# Patient Record
Sex: Female | Born: 1990 | Race: White | Hispanic: No | Marital: Married | Smoking: Never smoker
Health system: Southern US, Community
[De-identification: ages and names within clinical notes are randomized; demographics above are authoritative.]

## PROBLEM LIST (undated history)

## (undated) DIAGNOSIS — E282 Polycystic ovarian syndrome: Secondary | ICD-10-CM

## (undated) DIAGNOSIS — F419 Anxiety disorder, unspecified: Secondary | ICD-10-CM

## (undated) DIAGNOSIS — B019 Varicella without complication: Secondary | ICD-10-CM

## (undated) DIAGNOSIS — T7840XA Allergy, unspecified, initial encounter: Secondary | ICD-10-CM

## (undated) HISTORY — PX: TONSILLECTOMY: SUR1361

## (undated) HISTORY — DX: Polycystic ovarian syndrome: E28.2

## (undated) HISTORY — DX: Anxiety disorder, unspecified: F41.9

## (undated) HISTORY — DX: Allergy, unspecified, initial encounter: T78.40XA

## (undated) HISTORY — DX: Varicella without complication: B01.9

---
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
## 2011-01-12 LAB — URINALYSIS, ROUTINE W REFLEX MICROSCOPIC
Nitrite: NEGATIVE
Protein, ur: 100 — AB
Specific Gravity, Urine: 1.021
Urobilinogen, UA: 1

## 2011-01-12 LAB — COMPREHENSIVE METABOLIC PANEL
ALT: 20
Alkaline Phosphatase: 86
BUN: 9
CO2: 26
Calcium: 9.6
Glucose, Bld: 109 — ABNORMAL HIGH
Potassium: 4.6
Sodium: 139
Total Protein: 7

## 2011-01-12 LAB — URINE MICROSCOPIC-ADD ON

## 2011-01-12 LAB — DIFFERENTIAL
Basophils Relative: 1
Eosinophils Absolute: 0.3
Monocytes Relative: 7
Neutro Abs: 5.7
Neutrophils Relative %: 68

## 2011-01-12 LAB — CBC
HCT: 40.2
Hemoglobin: 13.6
MCHC: 33.8
RBC: 5
RDW: 14.9 — ABNORMAL HIGH

## 2011-01-12 LAB — LIPASE, BLOOD: Lipase: 25

## 2011-01-12 LAB — PREGNANCY, URINE: Preg Test, Ur: NEGATIVE

## 2011-10-15 ENCOUNTER — Encounter: Payer: Self-pay | Admitting: Family Medicine

## 2011-10-15 ENCOUNTER — Ambulatory Visit (INDEPENDENT_AMBULATORY_CARE_PROVIDER_SITE_OTHER): Payer: BC Managed Care – PPO | Admitting: Family Medicine

## 2011-10-15 VITALS — BP 128/74 | HR 119 | Temp 98.2°F | Ht 68.5 in | Wt 378.0 lb

## 2011-10-15 DIAGNOSIS — F419 Anxiety disorder, unspecified: Secondary | ICD-10-CM

## 2011-10-15 DIAGNOSIS — F411 Generalized anxiety disorder: Secondary | ICD-10-CM

## 2011-10-15 LAB — CBC WITH DIFFERENTIAL/PLATELET
Basophils Relative: 0.4 % (ref 0.0–3.0)
Eosinophils Absolute: 0.5 10*3/uL (ref 0.0–0.7)
Eosinophils Relative: 4 % (ref 0.0–5.0)
HCT: 42.4 % (ref 36.0–46.0)
Hemoglobin: 13.9 g/dL (ref 12.0–15.0)
MCHC: 32.9 g/dL (ref 30.0–36.0)
MCV: 83.2 fl (ref 78.0–100.0)
Monocytes Absolute: 0.6 10*3/uL (ref 0.1–1.0)
Monocytes Relative: 4.8 % (ref 3.0–12.0)
Neutro Abs: 8.4 10*3/uL — ABNORMAL HIGH (ref 1.4–7.7)
Neutrophils Relative %: 68.1 % (ref 43.0–77.0)
RBC: 5.09 Mil/uL (ref 3.87–5.11)
WBC: 12.4 10*3/uL — ABNORMAL HIGH (ref 4.5–10.5)

## 2011-10-15 LAB — BASIC METABOLIC PANEL
Calcium: 9.4 mg/dL (ref 8.4–10.5)
Creatinine, Ser: 0.9 mg/dL (ref 0.4–1.2)
GFR: 83.1 mL/min (ref >60.00)
Sodium: 140 mEq/L (ref 135–145)

## 2011-10-15 LAB — HEPATIC FUNCTION PANEL
Alkaline Phosphatase: 72 U/L (ref 39–117)
Bilirubin, Direct: 0.1 mg/dL (ref 0.0–0.3)
Total Bilirubin: 0.4 mg/dL (ref 0.3–1.2)
Total Protein: 7.6 g/dL (ref 6.0–8.3)

## 2011-10-15 MED ORDER — FLUOXETINE HCL 20 MG PO TABS: 20.0000 mg | ORAL_TABLET | Freq: Every day | ORAL | Status: DC

## 2011-10-15 MED ORDER — ALPRAZOLAM 0.5 MG PO TABS: 0.5000 mg | ORAL_TABLET | Freq: Three times a day (TID) | ORAL | Status: AC | PRN

## 2011-10-15 NOTE — Progress Notes (Signed)
  Subjective:    Patient ID: Kristen Burns, female    DOB: [birth year], 21 y.o.   MRN: [id number]  HPI 21 yr old female to establish and to discuss anxiety. She says she feels anxious all the time and worries about things every day. Some days are worse than others, and she gets episodes where she feels so anxious that she gets SOB with chest pressure, her heart races, and she feels like she is "going to die". This happens about twice a week. She started having problems with anxiety in 2008, and she took Lexapro for awhile at that time. This did not help her much. Her family members have a lot of anxiety issues as well, and her father has panic attacks. She is a Holiday representative at [employer]. She also has polycystic ovaries, and she took Metformin and BCP for a few years. She has not seen her GYN for several years.    Review of Systems  Constitutional: Negative.   Respiratory: Positive for chest tightness. Negative for cough and wheezing.   Cardiovascular: Positive for chest pain and palpitations. Negative for leg swelling.  Gastrointestinal: Negative.   Genitourinary: Negative.   Neurological: Negative.   Psychiatric/Behavioral: Positive for disturbed wake/sleep cycle, decreased concentration and agitation. Negative for hallucinations, behavioral problems, confusion and dysphoric mood. The patient is nervous/anxious. The patient is not hyperactive.        Objective:   Physical Exam  Constitutional: She is oriented to person, place, and time. No distress.       Morbidly obese   Neck: No thyromegaly present.  Cardiovascular: Normal rate, regular rhythm, normal heart sounds and intact distal pulses.   Pulmonary/Chest: Effort normal and breath sounds normal.  Lymphadenopathy:    She has no cervical adenopathy.  Neurological: She is alert and oriented to person, place, and time.  Psychiatric: She has a normal mood and affect. Her behavior is normal. Judgment and thought content normal.           Assessment & Plan:  This sounds like classic panic disorder, with a long hx of anxiety. She seems to have no real depression issues. We will start her on Prozac 20 mg a day and supplement with Xanax prn. Get labs today to rule out metabolic problems. Recheck in 3 weeks. We spent 45 minutes discussing this, at least 50% of which was in counseling.

## 2011-10-16 NOTE — Progress Notes (Signed)
Quick Note:  I spoke with pt ______ 

## 2011-11-05 ENCOUNTER — Ambulatory Visit: Payer: BC Managed Care – PPO | Admitting: Family Medicine

## 2011-11-07 ENCOUNTER — Encounter: Payer: Self-pay | Admitting: Family Medicine

## 2011-11-07 ENCOUNTER — Ambulatory Visit (INDEPENDENT_AMBULATORY_CARE_PROVIDER_SITE_OTHER): Payer: BC Managed Care – PPO | Admitting: Family Medicine

## 2011-11-07 VITALS — HR 130 | Temp 98.3°F

## 2011-11-07 DIAGNOSIS — F419 Anxiety disorder, unspecified: Secondary | ICD-10-CM | POA: Insufficient documentation

## 2011-11-07 DIAGNOSIS — F411 Generalized anxiety disorder: Secondary | ICD-10-CM

## 2011-11-07 MED ORDER — FLUOXETINE HCL 20 MG PO TABS: 40.0000 mg | ORAL_TABLET | Freq: Every day | ORAL | Status: DC

## 2011-11-07 NOTE — Progress Notes (Signed)
  Subjective:    Patient ID: Kristen Burns, female    DOB: February 24, 1991, 21 y.o.   MRN: [id number]  HPI Here with her mother to follow up an anxiety and panic attacks. She has been taking Prozac 20 mg a day for 3 weeks, and she does think it has helped a little. She still has a lot of daily anxiety however, and she has 2-3 panic attacks a week. She has used Xanax about once a day, and she gets immediate relief with this. She has started on psychotherapy once a week with Dallie Dad at the [facility].    Review of Systems  Constitutional: Negative.   Respiratory: Positive for chest tightness and shortness of breath. Negative for cough and wheezing.   Cardiovascular: Positive for chest pain and palpitations. Negative for leg swelling.  Psychiatric/Behavioral: Positive for agitation. The patient is nervous/anxious.        Objective:   Physical Exam  Constitutional: She appears well-developed and well-nourished.  Cardiovascular: Normal rate, regular rhythm, normal heart sounds and intact distal pulses.   Pulmonary/Chest: Effort normal and breath sounds normal.  Psychiatric: Her behavior is normal. Thought content normal.       Anxious and tearful          Assessment & Plan:  We will increase the Prozac to 40 mg a day. Continue with prn Xanax and therapy. Recheck in one week

## 2011-11-14 ENCOUNTER — Ambulatory Visit: Payer: BC Managed Care – PPO | Admitting: Family Medicine

## 2011-11-14 DIAGNOSIS — Z0289 Encounter for other administrative examinations: Secondary | ICD-10-CM

## 2011-11-21 ENCOUNTER — Ambulatory Visit: Payer: BC Managed Care – PPO | Admitting: Family Medicine

## 2011-11-23 ENCOUNTER — Encounter: Payer: Self-pay | Admitting: Family Medicine

## 2011-11-23 ENCOUNTER — Ambulatory Visit (INDEPENDENT_AMBULATORY_CARE_PROVIDER_SITE_OTHER): Payer: BC Managed Care – PPO | Admitting: Family Medicine

## 2011-11-23 VITALS — BP 120/82 | HR 98 | Temp 98.3°F | Wt 373.0 lb

## 2011-11-23 DIAGNOSIS — F419 Anxiety disorder, unspecified: Secondary | ICD-10-CM

## 2011-11-23 DIAGNOSIS — F411 Generalized anxiety disorder: Secondary | ICD-10-CM

## 2011-11-23 MED ORDER — FLUOXETINE HCL 40 MG PO CAPS: 40.0000 mg | ORAL_CAPSULE | Freq: Every day | ORAL | Status: DC

## 2011-11-23 NOTE — Progress Notes (Signed)
  Subjective:    Patient ID: Kristen Burns, female    DOB: [birth year], 21 y.o.   MRN: [id number]  HPI Here to follow up on anxiety. Since we doubled her Prozac to 40 mg a day she has done extremely well. She has had no panic attacks and has not required a single xanax in the past week. She is relaxed and happy, and she has no side effects.    Review of Systems  Constitutional: Negative.   Psychiatric/Behavioral: Negative.        Objective:   Physical Exam  Constitutional: She appears well-developed and well-nourished.  Psychiatric: She has a normal mood and affect. Her behavior is normal. Thought content normal.          Assessment & Plan:  She is doing well. Stay on 40 mg a day of prozac. Recheck in 3 months

## 2012-02-20 ENCOUNTER — Ambulatory Visit: Payer: BC Managed Care – PPO | Admitting: Family Medicine

## 2012-02-20 DIAGNOSIS — Z0289 Encounter for other administrative examinations: Secondary | ICD-10-CM

## 2012-06-05 ENCOUNTER — Telehealth: Payer: Self-pay | Admitting: Family Medicine

## 2012-06-05 MED ORDER — ALPRAZOLAM 0.5 MG PO TABS: 0.5000 mg | ORAL_TABLET | Freq: Three times a day (TID) | ORAL | Status: DC | PRN

## 2012-06-05 NOTE — Telephone Encounter (Signed)
Patient Information:  Caller Name: Denine  Phone: [phone]  Patient: Kristen Burns, Kristen Burns  Gender: Female  DOB: June 15, 1990  Age: 22 Years  PCP: Gershon Crane [facility])  Pregnant: No  Office Follow Up:  Does the office need to follow up with this patient?: Yes  Instructions For The Office: Call back regarding possible work in appointment for panic attack today vs refill Xanax.  RN Note:  Emergent Call:  regarding panic attack. LMP mid December/ History PCOS with irregular menses. Advised to see MD within 24 hours per nursing judgement (since no current Xanax Rx, last office visit 11/23/11 and emergent call for panic attack) for history of panic attack and current attack not resolved per Anxiety:Panic guideline.  After explained RN recommends appointment, she reported she is feeling "much better" now with lessening symptoms since she took her last "left over" 0.25 Xanax.  Has not been taking Xanax 0.5 mg TID since she has not needed it.  Asking for refill before leaves to go out of town today after 1400 06/05/12.  [facility]  No appointments remain with Dr Clent Ridges; Informed staff will call back regarding possible work-in appointment for today.   Symptoms  Reason For Call & Symptoms: Panic attack; Emergent call to get Xanax refilled before going out of town until 06/10/12.  Reports with racing pulse, generalized anxiety, mild short of breath, nervous and sweaty palms at 0945 06/05/12.  Not currently on Prosac  Reviewed Health History In EMR: Yes  Reviewed Medications In EMR: Yes  Reviewed Allergies In EMR: Yes  Reviewed Surgeries / Procedures: Yes  Date of Onset of Symptoms: 06/05/2012  Treatments Tried: 0.25 Xanax  Treatments Tried Worked: No OB / GYN:  LMP: 03/19/2012  Guideline(s) Used:  No Protocol Available - Sick Adult  Disposition Per Guideline:   See Today in Office  Reason For Disposition Reached:   Nursing judgment  Advice Given:  N/A

## 2012-06-05 NOTE — Telephone Encounter (Signed)
Call in Xanax 0.5 mg tid prn anxiety, #90 with 2 rf

## 2012-06-05 NOTE — Telephone Encounter (Signed)
I called in script and spoke with pt. She will call back to schedule the office visit, due to going out of town tomorrow.

## 2012-06-24 ENCOUNTER — Ambulatory Visit (INDEPENDENT_AMBULATORY_CARE_PROVIDER_SITE_OTHER): Payer: BC Managed Care – PPO | Admitting: Family Medicine

## 2012-06-24 ENCOUNTER — Encounter: Payer: Self-pay | Admitting: Family Medicine

## 2012-06-24 VITALS — BP 130/80 | HR 114 | Temp 97.8°F

## 2012-06-24 DIAGNOSIS — F411 Generalized anxiety disorder: Secondary | ICD-10-CM

## 2012-06-24 DIAGNOSIS — F419 Anxiety disorder, unspecified: Secondary | ICD-10-CM

## 2012-06-24 MED ORDER — FLUOXETINE HCL 40 MG PO CAPS: 40.0000 mg | ORAL_CAPSULE | Freq: Every day | ORAL | Status: DC

## 2012-06-24 MED ORDER — ALPRAZOLAM 0.5 MG PO TABS: 0.5000 mg | ORAL_TABLET | Freq: Three times a day (TID) | ORAL | Status: DC | PRN

## 2012-06-24 NOTE — Progress Notes (Signed)
  Subjective:    Patient ID: Kristen Burns, female    DOB: [birth year], 22 y.o.   MRN: [id number]  HPI Here with mother for a flare of her anxiety. Last summer she had been doing quite well on prozac but she stopped taking this about 3 months ago, then several weeks ago she started having anxiety attacks again with SOB and chest discomfort. She uses Xanax sporadically.    Review of Systems  Constitutional: Negative.   Respiratory: Positive for chest tightness and shortness of breath.   Cardiovascular: Positive for chest pain. Negative for palpitations and leg swelling.  Psychiatric/Behavioral: Negative for confusion, dysphoric mood, decreased concentration and agitation. The patient is nervous/anxious.        Objective:   Physical Exam  Constitutional: She appears well-developed and well-nourished. No distress.  Cardiovascular: Normal rate, regular rhythm, normal heart sounds and intact distal pulses.   Pulmonary/Chest: Effort normal and breath sounds normal.  Psychiatric: She has a normal mood and affect. Her behavior is normal. Thought content normal.          Assessment & Plan:  She will start back on Prozac and I strongly urged her to never stop this without consulting with [place] first.

## 2012-10-20 ENCOUNTER — Ambulatory Visit: Payer: BC Managed Care – PPO | Admitting: Family Medicine

## 2012-10-23 ENCOUNTER — Ambulatory Visit: Payer: BC Managed Care – PPO | Admitting: Family Medicine

## 2012-10-28 ENCOUNTER — Ambulatory Visit (INDEPENDENT_AMBULATORY_CARE_PROVIDER_SITE_OTHER): Payer: BC Managed Care – PPO | Admitting: Family Medicine

## 2012-10-28 ENCOUNTER — Encounter: Payer: Self-pay | Admitting: Family Medicine

## 2012-10-28 VITALS — BP 130/86 | HR 97 | Temp 98.4°F

## 2012-10-28 DIAGNOSIS — F411 Generalized anxiety disorder: Secondary | ICD-10-CM

## 2012-10-28 DIAGNOSIS — Z0000 Encounter for general adult medical examination without abnormal findings: Secondary | ICD-10-CM

## 2012-10-28 DIAGNOSIS — F419 Anxiety disorder, unspecified: Secondary | ICD-10-CM

## 2012-10-28 LAB — POCT URINALYSIS DIPSTICK
Glucose, UA: NEGATIVE
Ketones, UA: NEGATIVE
Spec Grav, UA: 1.02

## 2012-10-28 LAB — CBC WITH DIFFERENTIAL/PLATELET
Basophils Relative: 0.6 % (ref 0.0–3.0)
Eosinophils Relative: 5.7 % — ABNORMAL HIGH (ref 0.0–5.0)
HCT: 42.6 % (ref 36.0–46.0)
Hemoglobin: 13.8 g/dL (ref 12.0–15.0)
Lymphs Abs: 2.4 10*3/uL (ref 0.7–4.0)
Monocytes Relative: 5.2 % (ref 3.0–12.0)
Neutro Abs: 6.4 10*3/uL (ref 1.4–7.7)
WBC: 9.9 10*3/uL (ref 4.5–10.5)

## 2012-10-28 LAB — HEPATIC FUNCTION PANEL
ALT: 22 U/L (ref 0–35)
Alkaline Phosphatase: 73 U/L (ref 39–117)
Bilirubin, Direct: 0.1 mg/dL (ref 0.0–0.3)
Total Bilirubin: 0.4 mg/dL (ref 0.3–1.2)

## 2012-10-28 LAB — BASIC METABOLIC PANEL
Calcium: 9.5 mg/dL (ref 8.4–10.5)
Chloride: 104 mEq/L (ref 96–112)
Creatinine, Ser: 1 mg/dL (ref 0.4–1.2)
Sodium: 138 mEq/L (ref 135–145)

## 2012-10-28 LAB — LIPID PANEL
LDL Cholesterol: 123 mg/dL — ABNORMAL HIGH (ref 0–99)
Total CHOL/HDL Ratio: 5
Triglycerides: 124 mg/dL (ref 0.0–149.0)

## 2012-10-28 LAB — TSH: TSH: 1.86 u[IU]/mL (ref 0.35–5.50)

## 2012-10-28 MED ORDER — FLUOXETINE HCL 40 MG PO CAPS: 40.0000 mg | ORAL_CAPSULE | Freq: Two times a day (BID) | ORAL | Status: DC

## 2012-10-28 MED ORDER — ALPRAZOLAM 0.5 MG PO TABS: 0.5000 mg | ORAL_TABLET | Freq: Three times a day (TID) | ORAL | Status: DC | PRN

## 2012-10-28 NOTE — Progress Notes (Signed)
  Subjective:    Patient ID: Kristen Burns, female    DOB: [birth year], 22 y.o.   MRN: [id number]  HPI Here to follow up on anxiety. She did well on Prozac for several months, but lately her panic attacks have been more frequent and more intense. She feels her heart racing and pounding, and she thinks she is going to die. Several family members have been in the hospital lately, and she worries about them. She asks of she can see a Psychiatrist for these issues. She graduated college in May and has been looking for a job since then.    Review of Systems  Respiratory: Negative.   Cardiovascular: Positive for palpitations. Negative for chest pain and leg swelling.  Psychiatric/Behavioral: Negative for behavioral problems, confusion, dysphoric mood, decreased concentration and agitation. The patient is nervous/anxious.        Objective:   Physical Exam  Constitutional: She appears well-developed and well-nourished.  Cardiovascular: Normal rate, regular rhythm, normal heart sounds and intact distal pulses.   Pulmonary/Chest: Effort normal and breath sounds normal.  Psychiatric: Her behavior is normal. Thought content normal.  Very anxious          Assessment & Plan:  We will increase the prozac to 40 mg bid. Add Xanax prn. I asked her to check with her insurance company to get a list of preferred psychiatrists, and we can help her get in touch with one of them. Get labs today

## 2012-10-30 ENCOUNTER — Telehealth: Payer: Self-pay | Admitting: Family Medicine

## 2012-10-30 NOTE — Telephone Encounter (Signed)
PT is calling to inquire about her lab results from 10/28/12. Please assist.

## 2012-10-31 NOTE — Progress Notes (Signed)
Quick Note:  I released results in my chart. ______ 

## 2012-10-31 NOTE — Telephone Encounter (Signed)
I left voice message with results and also I did release them in my chart.

## 2012-12-15 ENCOUNTER — Ambulatory Visit: Payer: BC Managed Care – PPO | Admitting: Family Medicine

## 2013-02-05 ENCOUNTER — Other Ambulatory Visit: Payer: Self-pay

## 2013-03-20 ENCOUNTER — Encounter: Payer: Self-pay | Admitting: Family Medicine

## 2013-03-20 ENCOUNTER — Ambulatory Visit (INDEPENDENT_AMBULATORY_CARE_PROVIDER_SITE_OTHER): Payer: BC Managed Care – PPO | Admitting: Family Medicine

## 2013-03-20 VITALS — BP 120/90 | Temp 97.4°F

## 2013-03-20 DIAGNOSIS — H6123 Impacted cerumen, bilateral: Secondary | ICD-10-CM

## 2013-03-20 DIAGNOSIS — H6120 Impacted cerumen, unspecified ear: Secondary | ICD-10-CM

## 2013-03-20 NOTE — Progress Notes (Signed)
Chief Complaint  Patient presents with  . Otalgia    left ear     HPI:  Acute visit for:  Ear Discomfort: -started about 3 days ago -L ear pain -mild, more discomfort like something in it, chrornic allergy issues not really worse then usual -denies: hearing loss, fevers, drainage from ear -has tried ibuprofen -she did put qtip in ear because itching last week to try to get wax out  ROS: See pertinent positives and negatives per HPI.  Past Medical History  Diagnosis Date  . Anxiety   . Chickenpox   . Allergy   . PCOS (polycystic ovarian syndrome)     sees [facility]    Past Surgical History  Procedure Laterality Date  . Tonsillectomy      Family History  Problem Relation Age of Onset  . Arthritis Mother   . Hyperlipidemia Mother   . Diabetes Mother   . Breast cancer Mother   . Arthritis Father   . Hyperlipidemia Father   . Hypertension Father   . Anxiety disorder Father   . Breast cancer    . Anxiety disorder      History   Social History  . Marital Status: Single    Spouse Name: N/A    Number of Children: N/A  . Years of Education: N/A   Social History Main Topics  . Smoking status: Never Smoker   . Smokeless tobacco: Never Used  . Alcohol Use: Yes     Comment: rare  . Drug Use: No  . Sexual Activity: None   Other Topics Concern  . None   Social History Narrative  . None    Current outpatient prescriptions:ALPRAZolam (XANAX) 0.5 MG tablet, Take 1 tablet (0.5 mg total) by mouth 3 (three) times daily as needed for anxiety., Disp: 90 tablet, Rfl: 2;  FLUoxetine (PROZAC) 40 MG capsule, Take 1 capsule (40 mg total) by mouth 2 (two) times daily., Disp: 60 capsule, Rfl: 5  EXAM:  Filed Vitals:   03/20/13 0800  BP: 120/90  Temp: 97.4 F (36.3 C)    Body mass index is 0.00 kg/(m^2).  GENERAL: vitals reviewed and listed above, alert, oriented, appears well hydrated and in no acute distress  HEENT: atraumatic, conjunttiva clear, no  obvious abnormalities on inspection of external nose and ears, cerumen impaction bilat ear canals, other wise normal exam  NECK: no obvious masses on inspection  LUNGS: clear to auscultation bilaterally, no wheezes, rales or rhonchi, good air movement  CV: HRRR, no peripheral edema  MS: moves all extremities without noticeable abnormality  PSYCH: pleasant and cooperative, no obvious depression or anxiety  ASSESSMENT AND PLAN:  Discussed the following assessment and plan:  Cerumen impaction, bilateral  -advised removing the wax for further exam of ears with currette or lavage, but pt prefers to try OTC ear drops for this and see her ENT if not improving -advised I was not able to visualize TM or part of ear canal, so can not rule out pathology in these areas and thus she would need to be seen if symptoms worsening or not improving   There are no Patient Instructions on file for this visit.   Kriste Basque R.

## 2013-03-20 NOTE — Progress Notes (Signed)
Pre visit review using our clinic review tool, if applicable. No additional management support is needed unless otherwise documented below in the visit note. 

## 2013-05-20 ENCOUNTER — Telehealth: Payer: Self-pay | Admitting: Family Medicine

## 2013-05-20 NOTE — Telephone Encounter (Signed)
Pt is on schedule for 05/21/13 to see Dr. Clent RidgesFry.

## 2013-05-20 NOTE — Telephone Encounter (Signed)
Patient Information:  Caller Name: Alexandr  Phone: [phone]  Patient: Kristen Burns, Kristen Burns  Gender: Female  DOB: 1990/07/04  Age: 23 Years  PCP: Gershon CraneFry, [facility])  Pregnant: No  Office Follow Up:  Does the office need to follow up with this patient?: No  Instructions For The Office: N/A  RN Note:  Irrregular menses, LMP 1/15.  Foot pain present when walks or stands for prolonged period of time.  Advised to see MD within 72 hours per nursing judgement.  Requested appointment for 05/21/13.  Symptoms  Reason For Call & Symptoms: Intermittent red color of toes and heels after standing or walking with mild aching in left lower leg, not just calf.  Redness resolves with rest and elevation.  Non smoker  Reviewed Health History In EMR: Yes  Reviewed Medications In EMR: Yes  Reviewed Allergies In EMR: Yes  Reviewed Surgeries / Procedures: Yes  Date of Onset of Symptoms: 05/17/2013  Treatments Tried: rest and elevate  Treatments Tried Worked: Yes OB / GYN:  LMP: Unknown  Guideline(s) Used:  Leg Pain  Foot Pain  Disposition Per Guideline:   See Within 2 Weeks in Office  Reason For Disposition Reached:   Foot pain is a chronic symptom (recurrent or ongoing AND lasting > 4 weeks)  Advice Given:  Aggravating Factors   Aging: Foot pain is more common in the elderly. During the aging process, the feet widen and flatten, and the skin becomes drier.  Pregnancy: Foot pain is more common in pregnant women. During pregnancy hormones cause the ligaments to relax, and the extra weight causes increased stress on the feet.  Obesity: Foot pain is also more common in overweight individuals. Being overweight puts excess stress on the bones and soft tissues of the feet.  Overuse: People that are in professions that require prolonged standing and walking commonly report foot pain.  Shoes: Poorly fitting or overly tight shoes are the underlying cause of many painful foot conditions. High  heels are bad for feet.  Call Back If:  Swelling, redness, or fever occur  Severe pain not relieved by pain medication  Pain lasts over 7 days  You become worse.  Patient Will Follow Care Advice:  YES  Appointment Scheduled:  05/21/2013 10:30:00 Appointment Scheduled Provider:  Gershon CraneFry, [facility](Family Practice)

## 2013-05-21 ENCOUNTER — Ambulatory Visit: Payer: Self-pay | Admitting: Family Medicine

## 2013-05-21 DIAGNOSIS — Z0289 Encounter for other administrative examinations: Secondary | ICD-10-CM

## 2013-06-26 ENCOUNTER — Encounter: Payer: Self-pay | Admitting: Family Medicine

## 2013-06-26 ENCOUNTER — Ambulatory Visit (INDEPENDENT_AMBULATORY_CARE_PROVIDER_SITE_OTHER): Payer: BC Managed Care – PPO | Admitting: Family Medicine

## 2013-06-26 VITALS — BP 120/70 | HR 100 | Temp 98.3°F

## 2013-06-26 DIAGNOSIS — E282 Polycystic ovarian syndrome: Secondary | ICD-10-CM | POA: Insufficient documentation

## 2013-06-26 DIAGNOSIS — R232 Flushing: Secondary | ICD-10-CM

## 2013-06-26 LAB — BASIC METABOLIC PANEL
BUN: 10 mg/dL (ref 6–23)
CHLORIDE: 105 meq/L (ref 96–112)
CO2: 29 mEq/L (ref 19–32)
Calcium: 9.4 mg/dL (ref 8.4–10.5)
Creatinine, Ser: 1 mg/dL (ref 0.4–1.2)
GFR: 72.49 mL/min (ref >60.00)
Glucose, Bld: 94 mg/dL (ref 70–99)
POTASSIUM: 3.7 meq/L (ref 3.5–5.1)
SODIUM: 141 meq/L (ref 135–145)

## 2013-06-26 LAB — CBC WITH DIFFERENTIAL/PLATELET
Basophils Absolute: 0 10*3/uL (ref 0.0–0.1)
Basophils Relative: 0.4 % (ref 0.0–3.0)
EOS PCT: 4 % (ref 0.0–5.0)
Eosinophils Absolute: 0.4 10*3/uL (ref 0.0–0.7)
HEMATOCRIT: 41.6 % (ref 36.0–46.0)
HEMOGLOBIN: 13.7 g/dL (ref 12.0–15.0)
LYMPHS ABS: 2.9 10*3/uL (ref 0.7–4.0)
Lymphocytes Relative: 28.4 % (ref 12.0–46.0)
MCHC: 32.9 g/dL (ref 30.0–36.0)
MCV: 82.7 fl (ref 78.0–100.0)
MONO ABS: 0.5 10*3/uL (ref 0.1–1.0)
MONOS PCT: 4.9 % (ref 3.0–12.0)
NEUTROS ABS: 6.3 10*3/uL (ref 1.4–7.7)
Neutrophils Relative %: 62.3 % (ref 43.0–77.0)
PLATELETS: 260 10*3/uL (ref 150.0–400.0)
RBC: 5.03 Mil/uL (ref 3.87–5.11)
RDW: 14.9 % — AB (ref 11.5–14.6)
WBC: 10.1 10*3/uL (ref 4.5–10.5)

## 2013-06-26 LAB — HEPATIC FUNCTION PANEL
ALT: 23 U/L (ref 0–35)
AST: 20 U/L (ref 0–37)
Albumin: 4.2 g/dL (ref 3.5–5.2)
Alkaline Phosphatase: 78 U/L (ref 39–117)
BILIRUBIN DIRECT: 0 mg/dL (ref 0.0–0.3)
BILIRUBIN TOTAL: 0.3 mg/dL (ref 0.3–1.2)
Total Protein: 7.7 g/dL (ref 6.0–8.3)

## 2013-06-26 LAB — HEMOGLOBIN A1C: Hgb A1c MFr Bld: 6.2 % (ref 4.6–6.5)

## 2013-06-26 LAB — TSH: TSH: 1.74 u[IU]/mL (ref 0.35–5.50)

## 2013-06-26 MED ORDER — ALPRAZOLAM 0.5 MG PO TABS: 0.5000 mg | ORAL_TABLET | Freq: Three times a day (TID) | ORAL | Status: DC | PRN

## 2013-06-26 MED ORDER — ASPIRIN EC 325 MG PO TBEC: 325.0000 mg | DELAYED_RELEASE_TABLET | Freq: Every day | ORAL | Status: DC

## 2013-06-26 NOTE — Progress Notes (Signed)
Pre visit review using our clinic review tool, if applicable. No additional management support is needed unless otherwise documented below in the visit note. 

## 2013-06-26 NOTE — Progress Notes (Signed)
   Subjective:    Patient ID: Kristen Burns, female    DOB: 1990/07/13, 23 y.o.   MRN: [id number]  HPI Here asking about flushing that comes and goes in the hands, feet, and face. This started about 6 months ago but is getting more prominent. The area affected most often is the left foot. It gets red and warm at times, but it does not swell and is not painful. No SOB. Her anxiety has been stable. She has not had a menses for over a year, presumably due to her PCOS. Her last GYN exam was several years ago.    Review of Systems  Constitutional: Negative.   Respiratory: Negative.   Cardiovascular: Negative.   Skin: Positive for color change.  Hematological: Negative.        Objective:   Physical Exam  Constitutional: She appears well-developed and well-nourished.  Neck: No thyromegaly present.  Cardiovascular: Normal rate, regular rhythm, normal heart sounds and intact distal pulses.   Pulmonary/Chest: Effort normal and breath sounds normal.  Musculoskeletal: She exhibits no edema.  Lymphadenopathy:    She has no cervical adenopathy.  Skin: Skin is warm and dry. No rash noted. No erythema. No pallor.          Assessment & Plan:  Flushing of uncertain etiology. Get labs today. Start on aspirin daily. Strongly encouraged her to follow up with her GYN soon.

## 2013-06-27 LAB — ANGIOTENSIN CONVERTING ENZYME: ANGIOTENSIN-CONVERTING ENZYME: 59 U/L — AB (ref 8–52)

## 2013-06-29 LAB — ANTI-DNA ANTIBODY, DOUBLE-STRANDED: ds DNA Ab: <1 IU/mL

## 2013-06-30 ENCOUNTER — Telehealth: Payer: Self-pay | Admitting: Family Medicine

## 2013-06-30 NOTE — Telephone Encounter (Signed)
Pt would like blood work results °

## 2013-06-30 NOTE — Telephone Encounter (Signed)
I left a voice message with lab results.

## 2013-07-24 ENCOUNTER — Encounter: Payer: Self-pay | Admitting: Family Medicine

## 2013-08-14 ENCOUNTER — Encounter: Payer: Self-pay | Admitting: Family Medicine

## 2013-08-14 ENCOUNTER — Telehealth: Payer: Self-pay | Admitting: Family Medicine

## 2013-08-14 ENCOUNTER — Ambulatory Visit (INDEPENDENT_AMBULATORY_CARE_PROVIDER_SITE_OTHER): Payer: BC Managed Care – PPO | Admitting: Family Medicine

## 2013-08-14 VITALS — BP 140/82 | HR 112 | Temp 98.2°F | Resp 20

## 2013-08-14 DIAGNOSIS — J209 Acute bronchitis, unspecified: Secondary | ICD-10-CM

## 2013-08-14 NOTE — Progress Notes (Signed)
   Subjective:    Patient ID: Kristen Burns, female    DOB: 1990/04/12, 23 y.o.   MRN: [id number]  Cough Associated symptoms include wheezing. Pertinent negatives include no chills, fever or shortness of breath.   Acute visit. Two day history of cough. Mostly nonproductive. She's had some nasal congestion postnasal drip. Nonsmoker. No history of asthma. Possibly some mild wheezing off and on. No fevers or chills. No dyspnea.  Initial elevated blood pressure here but she has history of reported whitecoat syndrome and chronic anxiety. She took one Xanax when first got here and blood pressure promptly improved  Past Medical History  Diagnosis Date  . Anxiety   . Chickenpox   . Allergy   . PCOS (polycystic ovarian syndrome)     sees [facility]   Past Surgical History  Procedure Laterality Date  . Tonsillectomy      reports that she has never smoked. She has never used smokeless tobacco. She reports that she drinks alcohol. She reports that she does not use illicit drugs. family history includes Anxiety disorder in her father and another family member; Arthritis in her father and mother; Breast cancer in her mother and another family member; Diabetes in her mother; Hyperlipidemia in her father and mother; Hypertension in her father. Allergies  Allergen Reactions  . Penicillins     hives  . Sulfa Antibiotics     Hives       Review of Systems  Constitutional: Negative for fever and chills.  HENT: Positive for congestion.   Respiratory: Positive for cough and wheezing. Negative for shortness of breath.        Objective:   Physical Exam  Constitutional: She appears well-developed and well-nourished.  HENT:  Right Ear: External ear normal.  Left Ear: External ear normal.  Mouth/Throat: Oropharynx is clear and moist.  Neck: Neck supple.  Cardiovascular: Normal rate and regular rhythm.   Pulmonary/Chest: Effort normal and breath sounds normal. No respiratory  distress. She has no wheezes. She has no rales.          Assessment & Plan:  Cough probably secondary to viral bronchitis. No indication for antibiotics at this time. Possible allergic postnasal drip symptoms. Try over-the-counter Allegra or Claritin. Avoid decongestants.

## 2013-08-14 NOTE — Progress Notes (Signed)
Pre-visit discussion using our clinic review tool. No additional management support is needed unless otherwise documented below in the visit note.  

## 2013-08-14 NOTE — Telephone Encounter (Signed)
Left message to advise pt to see Dr. Caryl NeverBurchette at 3:30 as scheduled

## 2013-08-14 NOTE — Telephone Encounter (Signed)
Patient Information:  Caller Name: Agatha  Phone: [phone]  Patient: Kristen Burns, Kristen Burns  Gender: Female  DOB: 06-May-1990  Age: 23 Years  PCP: Gershon CraneFry, [facility](Family Practice)  Pregnant: No  Office Follow Up:  Does the office need to follow up with this patient?: Yes  Instructions For The Office: Dr. Claris CheFry's schedule full krs/can  RN Note:  Onset of cold symptoms 08/12/13 but cough started shortly thereafter, and now patient c/o chest/rib pain on coughing. Afebrile.  States she has mild wheeze.  Per cough protocol, disposition See in Office Now due to presence of wheeze; info to office for provider review/workin per office protocol.  As Dr. Claris CheFry's schedule is full, back up appt scheduled first available 1530 08/14/13 with Dr. Caryl NeverBurchette.  May reach patient at [phone].  krs/can  Symptoms  Reason For Call & Symptoms: cough  Reviewed Health History In EMR: Yes  Reviewed Medications In EMR: Yes  Reviewed Allergies In EMR: Yes  Reviewed Surgeries / Procedures: Yes  Date of Onset of Symptoms: 08/12/2013 OB / GYN:  LMP: Unknown  Guideline(s) Used:  Cough  Disposition Per Guideline:   Go to Office Now  Reason For Disposition Reached:   Wheezing is present  Advice Given:  N/A  Patient Will Follow Care Advice:  YES

## 2013-08-14 NOTE — Patient Instructions (Signed)
Acute Bronchitis Bronchitis is inflammation of the airways that extend from the windpipe into the lungs (bronchi). The inflammation often causes mucus to develop. This leads to a cough, which is the most common symptom of bronchitis.  In acute bronchitis, the condition usually develops suddenly and goes away over time, usually in a couple weeks. Smoking, allergies, and asthma can make bronchitis worse. Repeated episodes of bronchitis may cause further lung problems.  CAUSES Acute bronchitis is most often caused by the same virus that causes a cold. The virus can spread from person to person (contagious).  SIGNS AND SYMPTOMS   Cough.   Fever.   Coughing up mucus.   Body aches.   Chest congestion.   Chills.   Shortness of breath.   Sore throat.  DIAGNOSIS  Acute bronchitis is usually diagnosed through a physical exam. Tests, such as chest X-rays, are sometimes done to rule out other conditions.  TREATMENT  Acute bronchitis usually goes away in a couple weeks. Often times, no medical treatment is necessary. Medicines are sometimes given for relief of fever or cough. Antibiotics are usually not needed but may be prescribed in certain situations. In some cases, an inhaler may be recommended to help reduce shortness of breath and control the cough. A cool mist vaporizer may also be used to help thin bronchial secretions and make it easier to clear the chest.  HOME CARE INSTRUCTIONS  Get plenty of rest.   Drink enough fluids to keep your urine clear or pale yellow (unless you have a medical condition that requires fluid restriction). Increasing fluids may help thin your secretions and will prevent dehydration.   Only take over-the-counter or prescription medicines as directed by your health care provider.   Avoid smoking and secondhand smoke. Exposure to cigarette smoke or irritating chemicals will make bronchitis worse. If you are a smoker, consider using nicotine gum or skin  patches to help control withdrawal symptoms. Quitting smoking will help your lungs heal faster.   Reduce the chances of another bout of acute bronchitis by washing your hands frequently, avoiding people with cold symptoms, and trying not to touch your hands to your mouth, nose, or eyes.   Follow up with your health care provider as directed.  SEEK MEDICAL CARE IF: Your symptoms do not improve after 1 week of treatment.  SEEK IMMEDIATE MEDICAL CARE IF:  You develop an increased fever or chills.   You have chest pain.   You have severe shortness of breath.  You have bloody sputum.   You develop dehydration.  You develop fainting.  You develop repeated vomiting.  You develop a severe headache. MAKE SURE YOU:   Understand these instructions.  Will watch your condition.  Will get help right away if you are not doing well or get worse. Document Released: 04/26/2004 Document Revised: 11/19/2012 Document Reviewed: 09/09/2012 [facility] Patient Information 2014 [employer] [place].  Avoid decongestants Try over the counter Allegra or Claritan or Zyrtec for nasal congestion symptoms.

## 2013-12-25 ENCOUNTER — Telehealth: Payer: Self-pay | Admitting: Family Medicine

## 2013-12-25 NOTE — Telephone Encounter (Signed)
Pt request refill of the following: ALPRAZolam (XANAX) 0.5 MG tablet ° ° °Phamacy:   [facility] [address]  ° °

## 2013-12-28 NOTE — Telephone Encounter (Signed)
Call in #[phone] with 5 rf 

## 2013-12-29 MED ORDER — ALPRAZOLAM 0.5 MG PO TABS: 0.5000 mg | ORAL_TABLET | Freq: Three times a day (TID) | ORAL | Status: DC | PRN

## 2013-12-29 NOTE — Telephone Encounter (Signed)
I called in script 

## 2014-05-25 ENCOUNTER — Other Ambulatory Visit: Payer: Self-pay | Admitting: Family Medicine

## 2014-05-25 NOTE — Telephone Encounter (Signed)
Patient Name: Kristen HazelKATRINA Malphrus  DOB: [birth year]    Initial Comment Caller states what kind of vaccinations does she need to go to [place]?    Nurse Assessment  Nurse: Harlon FlorWhitaker, RN, Darl PikesSusan Date/Time (Eastern Time): 05/25/2014 4:40:22 PM  Confirm and document reason for call. If symptomatic, describe symptoms. ---Caller states what kind of vaccinations does she need to go to [place]? She was told by CDC to take malaria pills before during and after . She is leaving in May 2016 for a mission trip.  Has the patient traveled out of the country within the last 30 days? ---No  Does the patient require triage? ---No  Please document clinical information provided and list any resource used. ---RN advised caller to contact the [facility] Caller verbalized understanding RN gave her [facility]

## 2014-05-26 NOTE — Telephone Encounter (Signed)
See note concerning trip to [place]:  Pt states Health dept cannot see her until late march. Pt would like to have vacc this week b/c her insurance is going to lapse. Pt would like Hep a vacc and a typhoid inj,  And an rx for anti malaria pills.  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 pls advise pt needs by Monday.

## 2014-05-26 NOTE — Telephone Encounter (Signed)
We can give her a hep A shot and malaria pills, but we do not have typhoid shots. She needs an OV to do all this

## 2014-05-26 NOTE — Telephone Encounter (Signed)
I spoke with pt and she has appointment this week with health department, they were able to get her worked in. She will discuss everything during the visit.

## 2014-06-30 ENCOUNTER — Other Ambulatory Visit: Payer: Self-pay | Admitting: Family Medicine

## 2014-06-30 NOTE — Telephone Encounter (Signed)
PCP NA  Patient is over due for med check with dr Clent RidgesFRY. Can rx 30 tabs . Further refills per dr Clent RidgesFRY.

## 2014-09-27 ENCOUNTER — Other Ambulatory Visit: Payer: Self-pay | Admitting: Family Medicine

## 2014-09-27 NOTE — Telephone Encounter (Signed)
Call in #[phone] with no refills. She needs an OV soon  

## 2014-09-28 ENCOUNTER — Telehealth: Payer: Self-pay | Admitting: Family Medicine

## 2014-09-28 NOTE — Telephone Encounter (Signed)
Refill request for Xanax, per Dr. Clent RidgesFry okay to call in a 30 day supply and advise pt office visit is needed. I did call in script and left a detailed voice message for pt.

## 2016-12-20 ENCOUNTER — Encounter: Payer: Self-pay | Admitting: Family Medicine

## 2018-05-02 ENCOUNTER — Emergency Department (HOSPITAL_COMMUNITY): Payer: PRIVATE HEALTH INSURANCE

## 2018-05-02 ENCOUNTER — Emergency Department (HOSPITAL_COMMUNITY)
Admission: EM | Admit: 2018-05-02 | Discharge: 2018-05-02 | Disposition: A | Discharge status: Home / Self Care | Payer: PRIVATE HEALTH INSURANCE | Attending: Emergency Medicine | Admitting: Emergency Medicine

## 2018-05-02 ENCOUNTER — Encounter (HOSPITAL_COMMUNITY): Payer: Self-pay | Admitting: Emergency Medicine

## 2018-05-02 ENCOUNTER — Other Ambulatory Visit: Payer: Self-pay

## 2018-05-02 DIAGNOSIS — Y9389 Activity, other specified: Secondary | ICD-10-CM | POA: Insufficient documentation

## 2018-05-02 DIAGNOSIS — Y92414 Local residential or business street as the place of occurrence of the external cause: Secondary | ICD-10-CM | POA: Insufficient documentation

## 2018-05-02 DIAGNOSIS — S90412A Abrasion, left great toe, initial encounter: Secondary | ICD-10-CM | POA: Diagnosis not present

## 2018-05-02 DIAGNOSIS — S0990XA Unspecified injury of head, initial encounter: Secondary | ICD-10-CM | POA: Diagnosis not present

## 2018-05-02 DIAGNOSIS — S20212A Contusion of left front wall of thorax, initial encounter: Secondary | ICD-10-CM | POA: Diagnosis not present

## 2018-05-02 DIAGNOSIS — Y999 Unspecified external cause status: Secondary | ICD-10-CM | POA: Diagnosis not present

## 2018-05-02 DIAGNOSIS — T148XXA Other injury of unspecified body region, initial encounter: Secondary | ICD-10-CM

## 2018-05-02 LAB — PREGNANCY, URINE: Preg Test, Ur: NEGATIVE

## 2018-05-02 MED ORDER — TETANUS-DIPHTH-ACELL PERTUSSIS 5-2.5-18.5 LF-MCG/0.5 IM SUSP
0.5000 mL | Freq: Once | INTRAMUSCULAR | Status: AC
  Administered 2018-05-02: 0.5 mL via INTRAMUSCULAR
  Filled 2018-05-02: qty 0.5

## 2018-05-02 MED ORDER — ALPRAZOLAM 0.25 MG PO TABS
0.2500 mg | ORAL_TABLET | Freq: Once | ORAL | Status: AC
  Administered 2018-05-02: 0.25 mg via ORAL
  Filled 2018-05-02 (×2): qty 1
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
## 2018-05-02 NOTE — ED Notes (Signed)
Patient verbalizes understanding of discharge instructions. Opportunity for questioning and answers were provided. Armband removed by staff, pt discharged from ED ambulatory.   

## 2018-05-02 NOTE — ED Notes (Addendum)
Pt will not stay on monitor due to "panic attacks" when monitor alarms.
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
